# Patient Record
Sex: Male | Born: 1970 | Race: White | Hispanic: No | Marital: Married | Smoking: Never smoker
Health system: Southern US, Community
[De-identification: ages and names within clinical notes are randomized; demographics above are authoritative.]

## PROBLEM LIST (undated history)

## (undated) DIAGNOSIS — T8859XA Other complications of anesthesia, initial encounter: Secondary | ICD-10-CM

## (undated) DIAGNOSIS — K219 Gastro-esophageal reflux disease without esophagitis: Secondary | ICD-10-CM

---

## 2004-12-07 ENCOUNTER — Ambulatory Visit: Payer: Self-pay | Admitting: Internal Medicine

## 2005-06-07 ENCOUNTER — Ambulatory Visit: Payer: Self-pay | Admitting: Internal Medicine

## 2005-09-30 DIAGNOSIS — S4992XA Unspecified injury of left shoulder and upper arm, initial encounter: Secondary | ICD-10-CM | POA: Insufficient documentation

## 2005-12-05 ENCOUNTER — Ambulatory Visit: Payer: Self-pay | Admitting: Internal Medicine

## 2006-02-13 ENCOUNTER — Ambulatory Visit: Payer: Self-pay | Admitting: Internal Medicine

## 2006-03-07 ENCOUNTER — Ambulatory Visit: Payer: Self-pay | Admitting: Internal Medicine

## 2006-03-31 ENCOUNTER — Ambulatory Visit: Payer: Self-pay | Admitting: Internal Medicine

## 2008-02-16 ENCOUNTER — Ambulatory Visit: Payer: Self-pay | Admitting: Internal Medicine

## 2008-12-06 ENCOUNTER — Ambulatory Visit: Payer: Self-pay | Admitting: Internal Medicine

## 2011-05-24 ENCOUNTER — Ambulatory Visit: Payer: Self-pay | Admitting: Internal Medicine

## 2011-05-25 LAB — PSA

## 2012-06-13 ENCOUNTER — Other Ambulatory Visit: Payer: Self-pay | Admitting: Internal Medicine

## 2012-06-13 LAB — URINALYSIS, COMPLETE
Bilirubin,UR: NEGATIVE
Hyaline Cast: 1
Ketone: NEGATIVE
Leukocyte Esterase: NEGATIVE
Protein: NEGATIVE
RBC,UR: <1 /HPF (ref 0–5)
Specific Gravity: 1.027 (ref 1.003–1.030)
Squamous Epithelial: NONE SEEN
WBC UR: <1 /HPF (ref 0–5)

## 2012-06-13 LAB — CBC WITH DIFFERENTIAL/PLATELET
Basophil #: 0 10*3/uL (ref 0.0–0.1)
Basophil %: 0.6 %
Eosinophil #: 0.1 10*3/uL (ref 0.0–0.7)
HCT: 41.5 % (ref 40.0–52.0)
HGB: 13.7 g/dL (ref 13.0–18.0)
Lymphocyte %: 41.5 %
MCH: 30.7 pg (ref 26.0–34.0)
MCHC: 33 g/dL (ref 32.0–36.0)
Monocyte %: 8.9 %
WBC: 5.2 10*3/uL (ref 3.8–10.6)

## 2012-06-13 LAB — BASIC METABOLIC PANEL
Anion Gap: 8 (ref 7–16)
BUN: 16 mg/dL (ref 7–18)
Calcium, Total: 8.5 mg/dL (ref 8.5–10.1)
Chloride: 106 mmol/L (ref 98–107)
Creatinine: 0.7 mg/dL (ref 0.60–1.30)
EGFR (African American): >60

## 2012-06-13 LAB — LIPID PANEL: HDL Cholesterol: 51 mg/dL (ref 40–60)

## 2015-10-27 DIAGNOSIS — E785 Hyperlipidemia, unspecified: Secondary | ICD-10-CM | POA: Insufficient documentation

## 2015-10-27 DIAGNOSIS — R748 Abnormal levels of other serum enzymes: Secondary | ICD-10-CM | POA: Insufficient documentation

## 2019-12-23 ENCOUNTER — Ambulatory Visit: Payer: BC Managed Care – PPO | Attending: Internal Medicine

## 2019-12-23 DIAGNOSIS — Z20822 Contact with and (suspected) exposure to covid-19: Secondary | ICD-10-CM

## 2019-12-24 LAB — NOVEL CORONAVIRUS, NAA: SARS-CoV-2, NAA: NOT DETECTED

## 2020-08-08 ENCOUNTER — Ambulatory Visit: Payer: Self-pay | Attending: Critical Care Medicine

## 2020-08-08 DIAGNOSIS — Z23 Encounter for immunization: Secondary | ICD-10-CM

## 2020-08-08 NOTE — Progress Notes (Signed)
   Covid-19 Vaccination Clinic  Name:  Mark Wallace    MRN: [id number] DOB: 1971-08-18  08/08/2020  Mr. [name] was observed post Covid-19 immunization for 15 minutes without incident. He was provided with Vaccine Information Sheet and instruction to access the V-Safe system.   Mr. [name] was instructed to call 911 with any severe reactions post vaccine: Marland Kitchen Difficulty breathing  . Swelling of face and throat  . A fast heartbeat  . A bad rash all over body  . Dizziness and weakness   Immunizations Administered    Name Date Dose VIS Date Route   Pfizer COVID-19 Vaccine 08/08/2020  5:10 PM 0.3 mL 02/24/2019 Intramuscular   Manufacturer: [employer], [employer]   Lot: [id number]   [place]: [id number]

## 2020-08-29 ENCOUNTER — Ambulatory Visit: Payer: Self-pay | Attending: Internal Medicine

## 2020-08-29 DIAGNOSIS — Z23 Encounter for immunization: Secondary | ICD-10-CM

## 2020-08-29 NOTE — Progress Notes (Signed)
   Covid-19 Vaccination Clinic  Name:  Mark Wallace    MRN: [id number] DOB: 03-05-1971  08/29/2020  [name] was observed post Covid-19 immunization for 15 minutes without incident. He was provided with Vaccine Information Sheet and instruction to access the V-Safe system.   [name] was instructed to call 911 with any severe reactions post vaccine: Marland Kitchen Difficulty breathing  . Swelling of face and throat  . A fast heartbeat  . A bad rash all over body  . Dizziness and weakness   Immunizations Administered    Name Date Dose VIS Date Route   Pfizer COVID-19 Vaccine 08/29/2020  3:29 PM 0.3 mL 02/24/2019 Intramuscular   Manufacturer: Novel[employer]   Lot: [id number]   [place]: [id number]

## 2020-10-05 ENCOUNTER — Other Ambulatory Visit: Payer: BC Managed Care – PPO

## 2020-10-05 DIAGNOSIS — Z20822 Contact with and (suspected) exposure to covid-19: Secondary | ICD-10-CM

## 2020-10-06 LAB — SARS-COV-2, NAA 2 DAY TAT

## 2020-10-06 LAB — NOVEL CORONAVIRUS, NAA: SARS-CoV-2, NAA: NOT DETECTED

## 2020-10-15 HISTORY — PX: HAND SURGERY: SHX662

## 2020-10-15 HISTORY — PX: CHEST TUBE INSERTION: SHX231

## 2020-10-20 DIAGNOSIS — W19XXXA Unspecified fall, initial encounter: Secondary | ICD-10-CM | POA: Insufficient documentation

## 2020-10-20 DIAGNOSIS — E669 Obesity, unspecified: Secondary | ICD-10-CM | POA: Insufficient documentation

## 2020-10-20 DIAGNOSIS — S2220XA Unspecified fracture of sternum, initial encounter for closed fracture: Secondary | ICD-10-CM | POA: Insufficient documentation

## 2020-10-20 DIAGNOSIS — S2249XA Multiple fractures of ribs, unspecified side, initial encounter for closed fracture: Secondary | ICD-10-CM | POA: Insufficient documentation

## 2021-01-02 DIAGNOSIS — U071 COVID-19: Secondary | ICD-10-CM

## 2021-01-02 HISTORY — DX: COVID-19: U07.1

## 2021-01-09 DIAGNOSIS — D649 Anemia, unspecified: Secondary | ICD-10-CM | POA: Insufficient documentation

## 2021-01-09 DIAGNOSIS — Z9181 History of falling: Secondary | ICD-10-CM | POA: Insufficient documentation

## 2021-01-23 ENCOUNTER — Other Ambulatory Visit: Payer: Self-pay

## 2021-02-13 ENCOUNTER — Other Ambulatory Visit: Payer: Self-pay

## 2021-02-13 ENCOUNTER — Ambulatory Visit: Payer: BC Managed Care – PPO | Admitting: Gastroenterology

## 2021-02-13 VITALS — BP 134/87 | HR 70 | Temp 97.3°F | Ht 66.0 in | Wt 233.0 lb

## 2021-02-13 DIAGNOSIS — R1013 Epigastric pain: Secondary | ICD-10-CM | POA: Insufficient documentation

## 2021-02-13 MED ORDER — CLENPIQ 10-3.5-12 MG-GM -GM/160ML PO SOLN: 320.0000 mg | ORAL | 0 refills | Status: DC

## 2021-02-13 NOTE — Progress Notes (Signed)
Gastroenterology Consultation  Referring Provider:     Adin Hector, MD Primary Care Physician:  Adin Hector, MD Primary Gastroenterologist:  Dr. Allen Norris     Reason for Consultation:     Dyspepsia        HPI:   Mark Wallace is a 50 y.o. y/o male referred for consultation & management of Dyspepsia by Dr. Caryl Comes, Wendelyn Breslow III, MD.  This patient comes in today with a history of having abnormal liver enzymes as reported by his primary care provider that appears to have returned to normal in all of the most recent labs.  The patient also was found to have anemia with a normal iron and a ferritin that's on the lower limits of normal.  The elevated liver enzymes were reported to have resolved with weight loss.  The patient's most recent hemoglobin was 13.9 which is slightly below the lower limit of normal. The patient comes to see me after he states that his wife has seen in the past and he wanted to follow up with me.  The patient reports that his abdominal discomfort and reflux symptoms are worse when he gains weight or when he does not watch his diet.  The patient also reports that at certain times he doesn't take his medication and all and is fine and at other times he has to take it on a regular basis.  No past medical history on file.   Prior to Admission medications   Not on File    No family history on file.      Allergies as of 02/13/2021  . (No Known Allergies)    Review of Systems:    All systems reviewed and negative except where noted in HPI.   Physical Exam:  BP 134/87   Pulse 70   Temp (!) 97.3 F (36.3 C) (Temporal)   Ht 5\' 6"  (1.676 m)   Wt 233 lb (105.7 kg)   BMI 37.61 kg/m  No LMP for male patient. General:   Alert,  Well-developed, well-nourished, pleasant and cooperative in NAD Head:  Normocephalic and atraumatic. Eyes:  Sclera clear, no icterus.   Conjunctiva pink. Ears:  Normal auditory acuity. Neck:  Supple; no masses or thyromegaly. Lungs:   Respirations even and unlabored.  Clear throughout to auscultation.   No wheezes, crackles, or rhonchi. No acute distress. Heart:  Regular rate and rhythm; no murmurs, clicks, rubs, or gallops. Abdomen:  Normal bowel sounds.  No bruits.  Soft, non-tender and non-distended without masses, hepatosplenomegaly or hernias noted.  No guarding or rebound tenderness.  Negative Carnett sign.   Rectal:  Deferred.  Pulses:  Normal pulses noted. Extremities:  No clubbing or edema.  No cyanosis. Neurologic:  Alert and oriented x3;  grossly normal neurologically. Skin:  Intact without significant lesions or rashes.  No jaundice. Lymph Nodes:  No significant cervical adenopathy. Psych:  Alert and cooperative. Normal mood and affect.  Imaging Studies: No results found.  Assessment and Plan:   Mark Wallace is a 50 y.o. y/o male who comes in today with a history of mild anemia with normal iron studies and in need of a colonoscopy since he has never had a colonoscopy in the past.  The patient also has some dyspepsia with reflux symptoms.  The patient reports that he had imaging at some time in the past that stated he had a hiatal hernia.  The patient will be set up for an EGD  and colonoscopy due to his history of dyspepsia and need for screening colonoscopy.  The patient has been the plan and agrees with it.    [name], MD. Marval Regal    Note: This dictation was prepared with Dragon dictation along with smaller phrase technology. Any transcriptional errors that result from this process are unintentional.

## 2021-02-13 NOTE — H&P (View-Only) (Signed)
Gastroenterology Consultation  Referring Provider:     Adin Hector, MD Primary Care Physician:  Adin Hector, MD Primary Gastroenterologist:  Dr. Allen Norris     Reason for Consultation:     Dyspepsia        HPI:   Mark Wallace is a 50 y.o. y/o male referred for consultation & management of Dyspepsia by Dr. Caryl Comes, Wendelyn Breslow III, MD.  This patient comes in today with a history of having abnormal liver enzymes as reported by his primary care provider that appears to have returned to normal in all of the most recent labs.  The patient also was found to have anemia with a normal iron and a ferritin that's on the lower limits of normal.  The elevated liver enzymes were reported to have resolved with weight loss.  The patient's most recent hemoglobin was 13.9 which is slightly below the lower limit of normal. The patient comes to see me after he states that his wife has seen in the past and he wanted to follow up with me.  The patient reports that his abdominal discomfort and reflux symptoms are worse when he gains weight or when he does not watch his diet.  The patient also reports that at certain times he doesn't take his medication and all and is fine and at other times he has to take it on a regular basis.  No past medical history on file.   Prior to Admission medications   Not on File    No family history on file.      Allergies as of 02/13/2021  . (No Known Allergies)    Review of Systems:    All systems reviewed and negative except where noted in HPI.   Physical Exam:  BP 134/87   Pulse 70   Temp (!) 97.3 F (36.3 C) (Temporal)   Ht 5\' 6"  (1.676 m)   Wt 233 lb (105.7 kg)   BMI 37.61 kg/m  No LMP for male patient. General:   Alert,  Well-developed, well-nourished, pleasant and cooperative in NAD Head:  Normocephalic and atraumatic. Eyes:  Sclera clear, no icterus.   Conjunctiva pink. Ears:  Normal auditory acuity. Neck:  Supple; no masses or thyromegaly. Lungs:   Respirations even and unlabored.  Clear throughout to auscultation.   No wheezes, crackles, or rhonchi. No acute distress. Heart:  Regular rate and rhythm; no murmurs, clicks, rubs, or gallops. Abdomen:  Normal bowel sounds.  No bruits.  Soft, non-tender and non-distended without masses, hepatosplenomegaly or hernias noted.  No guarding or rebound tenderness.  Negative Carnett sign.   Rectal:  Deferred.  Pulses:  Normal pulses noted. Extremities:  No clubbing or edema.  No cyanosis. Neurologic:  Alert and oriented x3;  grossly normal neurologically. Skin:  Intact without significant lesions or rashes.  No jaundice. Lymph Nodes:  No significant cervical adenopathy. Psych:  Alert and cooperative. Normal mood and affect.  Imaging Studies: No results found.  Assessment and Plan:   Mark Wallace is a 50 y.o. y/o male who comes in today with a history of mild anemia with normal iron studies and in need of a colonoscopy since he has never had a colonoscopy in the past.  The patient also has some dyspepsia with reflux symptoms.  The patient reports that he had imaging at some time in the past that stated he had a hiatal hernia.  The patient will be set up for an EGD  and colonoscopy due to his history of dyspepsia and need for screening colonoscopy.  The patient has been the plan and agrees with it.    [name], MD. Marval Regal    Note: This dictation was prepared with Dragon dictation along with smaller phrase technology. Any transcriptional errors that result from this process are unintentional.

## 2021-02-15 ENCOUNTER — Other Ambulatory Visit: Payer: Self-pay

## 2021-02-15 DIAGNOSIS — R1013 Epigastric pain: Secondary | ICD-10-CM

## 2021-02-15 DIAGNOSIS — Z1211 Encounter for screening for malignant neoplasm of colon: Secondary | ICD-10-CM

## 2021-02-27 ENCOUNTER — Other Ambulatory Visit: Payer: Self-pay

## 2021-02-27 ENCOUNTER — Encounter: Payer: Self-pay | Admitting: Gastroenterology

## 2021-03-01 ENCOUNTER — Other Ambulatory Visit
Admission: RE | Admit: 2021-03-01 | Discharge: 2021-03-01 | Disposition: A | Discharge status: Home / Self Care | Payer: BC Managed Care – PPO | Source: Ambulatory Visit | Attending: Gastroenterology | Admitting: Gastroenterology

## 2021-03-01 ENCOUNTER — Other Ambulatory Visit: Payer: Self-pay

## 2021-03-01 DIAGNOSIS — Z01812 Encounter for preprocedural laboratory examination: Secondary | ICD-10-CM | POA: Insufficient documentation

## 2021-03-01 DIAGNOSIS — K449 Diaphragmatic hernia without obstruction or gangrene: Secondary | ICD-10-CM | POA: Diagnosis not present

## 2021-03-01 DIAGNOSIS — Z8616 Personal history of COVID-19: Secondary | ICD-10-CM | POA: Diagnosis not present

## 2021-03-01 DIAGNOSIS — D125 Benign neoplasm of sigmoid colon: Secondary | ICD-10-CM | POA: Diagnosis not present

## 2021-03-01 DIAGNOSIS — D649 Anemia, unspecified: Secondary | ICD-10-CM | POA: Diagnosis not present

## 2021-03-01 DIAGNOSIS — Z20822 Contact with and (suspected) exposure to covid-19: Secondary | ICD-10-CM | POA: Diagnosis not present

## 2021-03-01 DIAGNOSIS — Z79899 Other long term (current) drug therapy: Secondary | ICD-10-CM | POA: Diagnosis not present

## 2021-03-01 DIAGNOSIS — R1013 Epigastric pain: Secondary | ICD-10-CM | POA: Diagnosis present

## 2021-03-01 DIAGNOSIS — Z1211 Encounter for screening for malignant neoplasm of colon: Secondary | ICD-10-CM | POA: Diagnosis present

## 2021-03-01 DIAGNOSIS — K640 First degree hemorrhoids: Secondary | ICD-10-CM | POA: Diagnosis not present

## 2021-03-01 DIAGNOSIS — E669 Obesity, unspecified: Secondary | ICD-10-CM | POA: Diagnosis not present

## 2021-03-01 DIAGNOSIS — Z6837 Body mass index (BMI) 37.0-37.9, adult: Secondary | ICD-10-CM | POA: Diagnosis not present

## 2021-03-01 LAB — SARS CORONAVIRUS 2 (TAT 6-24 HRS): SARS Coronavirus 2: NEGATIVE

## 2021-03-02 NOTE — Discharge Instructions (Signed)

## 2021-03-03 ENCOUNTER — Ambulatory Visit: Payer: BC Managed Care – PPO | Admitting: Anesthesiology

## 2021-03-03 ENCOUNTER — Ambulatory Visit
Admission: RE | Admit: 2021-03-03 | Discharge: 2021-03-03 | Disposition: A | Discharge status: Home / Self Care | Payer: BC Managed Care – PPO | Attending: Gastroenterology | Admitting: Gastroenterology

## 2021-03-03 ENCOUNTER — Encounter
Admission: RE | Disposition: A | Discharge status: Home / Self Care | Payer: Self-pay | Source: Home / Self Care | Attending: Gastroenterology

## 2021-03-03 ENCOUNTER — Other Ambulatory Visit: Payer: Self-pay

## 2021-03-03 ENCOUNTER — Encounter: Payer: Self-pay | Admitting: Gastroenterology

## 2021-03-03 DIAGNOSIS — R1013 Epigastric pain: Secondary | ICD-10-CM | POA: Diagnosis not present

## 2021-03-03 DIAGNOSIS — K640 First degree hemorrhoids: Secondary | ICD-10-CM | POA: Insufficient documentation

## 2021-03-03 DIAGNOSIS — Z79899 Other long term (current) drug therapy: Secondary | ICD-10-CM | POA: Insufficient documentation

## 2021-03-03 DIAGNOSIS — E669 Obesity, unspecified: Secondary | ICD-10-CM | POA: Insufficient documentation

## 2021-03-03 DIAGNOSIS — K635 Polyp of colon: Secondary | ICD-10-CM | POA: Diagnosis not present

## 2021-03-03 DIAGNOSIS — Z20822 Contact with and (suspected) exposure to covid-19: Secondary | ICD-10-CM | POA: Insufficient documentation

## 2021-03-03 DIAGNOSIS — D125 Benign neoplasm of sigmoid colon: Secondary | ICD-10-CM | POA: Insufficient documentation

## 2021-03-03 DIAGNOSIS — Z1211 Encounter for screening for malignant neoplasm of colon: Secondary | ICD-10-CM

## 2021-03-03 DIAGNOSIS — Z6837 Body mass index (BMI) 37.0-37.9, adult: Secondary | ICD-10-CM | POA: Insufficient documentation

## 2021-03-03 DIAGNOSIS — Z8616 Personal history of COVID-19: Secondary | ICD-10-CM | POA: Insufficient documentation

## 2021-03-03 DIAGNOSIS — K449 Diaphragmatic hernia without obstruction or gangrene: Secondary | ICD-10-CM | POA: Insufficient documentation

## 2021-03-03 DIAGNOSIS — D649 Anemia, unspecified: Secondary | ICD-10-CM | POA: Insufficient documentation

## 2021-03-03 HISTORY — PX: ESOPHAGOGASTRODUODENOSCOPY (EGD) WITH PROPOFOL: SHX5813

## 2021-03-03 HISTORY — PX: COLONOSCOPY WITH PROPOFOL: SHX5780

## 2021-03-03 HISTORY — DX: Gastro-esophageal reflux disease without esophagitis: K21.9

## 2021-03-03 HISTORY — PX: POLYPECTOMY: SHX5525

## 2021-03-03 HISTORY — DX: Other complications of anesthesia, initial encounter: T88.59XA

## 2021-03-03 SURGERY — COLONOSCOPY WITH PROPOFOL
Anesthesia: General | Site: Rectum

## 2021-03-03 MED ORDER — LIDOCAINE HCL (CARDIAC) PF 100 MG/5ML IV SOSY
PREFILLED_SYRINGE | INTRAVENOUS | Status: DC | PRN
  Administered 2021-03-03: 50 mg via INTRAVENOUS

## 2021-03-03 MED ORDER — SODIUM CHLORIDE 0.9 % IV SOLN: INTRAVENOUS | Status: DC

## 2021-03-03 MED ORDER — GLYCOPYRROLATE 0.2 MG/ML IJ SOLN
INTRAMUSCULAR | Status: DC | PRN
  Administered 2021-03-03: .1 mg via INTRAVENOUS

## 2021-03-03 MED ORDER — PROPOFOL 10 MG/ML IV BOLUS
INTRAVENOUS | Status: DC | PRN
  Administered 2021-03-03: 150 mg via INTRAVENOUS
  Administered 2021-03-03: 30 mg via INTRAVENOUS
  Administered 2021-03-03: 40 mg via INTRAVENOUS
  Administered 2021-03-03: 30 mg via INTRAVENOUS
  Administered 2021-03-03: 50 mg via INTRAVENOUS
  Administered 2021-03-03: 40 mg via INTRAVENOUS
  Administered 2021-03-03: 30 mg via INTRAVENOUS
  Administered 2021-03-03: 40 mg via INTRAVENOUS

## 2021-03-03 MED ORDER — LACTATED RINGERS IV SOLN: INTRAVENOUS | Status: DC

## 2021-03-03 MED ORDER — STERILE WATER FOR IRRIGATION IR SOLN: Status: DC | PRN

## 2021-03-03 SURGICAL SUPPLY — 9 items
BLOCK BITE 60FR ADLT L/F GRN (MISCELLANEOUS) ×3 IMPLANT
GOWN CVR UNV OPN BCK APRN NK (MISCELLANEOUS) ×4 IMPLANT
GOWN ISOL THUMB LOOP REG UNIV (MISCELLANEOUS) ×6
KIT PRC NS LF DISP ENDO (KITS) ×2 IMPLANT
KIT PROCEDURE OLYMPUS (KITS) ×3
MANIFOLD NEPTUNE II (INSTRUMENTS) ×3 IMPLANT
SNARE COLD EXACTO (MISCELLANEOUS) ×3 IMPLANT
TRAP ETRAP POLY (MISCELLANEOUS) ×3 IMPLANT
WATER STERILE IRR 250ML POUR (IV SOLUTION) ×3 IMPLANT

## 2021-03-03 NOTE — Anesthesia Postprocedure Evaluation (Signed)
Anesthesia Post Note  Patient: Mark Wallace  Procedure(s) Performed: COLONOSCOPY WITH BIOPSY (N/A Rectum) ESOPHAGOGASTRODUODENOSCOPY (EGD) WITH PROPOFOL (N/A ) POLYPECTOMY (N/A Rectum)     Patient location during evaluation: PACU Anesthesia Type: General Level of consciousness: awake Pain management: pain level controlled Vital Signs Assessment: post-procedure vital signs reviewed and stable Respiratory status: respiratory function stable Cardiovascular status: stable Postop Assessment: no signs of nausea or vomiting Anesthetic complications: no   No complications documented.  Veda Canning

## 2021-03-03 NOTE — Op Note (Signed)
[facility] Gastroenterology Patient Name: Mark Wallace Procedure Date: 03/03/2021 10:03 AM MRN: [id number] Account #: [id number] Date of Birth: Mar 11, 1971 Admit Type: Outpatient Age: 50 Room: [facility] OR ROOM 01 Gender: Male Note Status: Finalized Procedure:             Colonoscopy Indications:           Screening for colorectal malignant neoplasm Providers:             [name] MD, MD Referring MD:          Ramonita Lab, MD (Referring MD) Medicines:             Propofol per Anesthesia Complications:         No immediate complications. Procedure:             Pre-Anesthesia Assessment:                        - Prior to the procedure, a History and Physical was                         performed, and patient medications and allergies were                         reviewed. The patient's tolerance of previous                         anesthesia was also reviewed. The risks and benefits                         of the procedure and the sedation options and risks                         were discussed with the patient. All questions were                         answered, and informed consent was obtained. Prior                         Anticoagulants: The patient has taken no previous                         anticoagulant or antiplatelet agents. ASA Grade                         Assessment: II - A patient with mild systemic disease.                         After reviewing the risks and benefits, the patient                         was deemed in satisfactory condition to undergo the                         procedure.                        After obtaining informed consent, the colonoscope was  passed under direct vision. Throughout the procedure,                         the patient's blood pressure, pulse, and oxygen                         saturations were monitored continuously. The                         Colonoscope was introduced through the anus  and                         advanced to the the cecum, identified by appendiceal                         orifice and ileocecal valve. The colonoscopy was                         performed without difficulty. The patient tolerated                         the procedure well. The quality of the bowel                         preparation was excellent. Findings:      The perianal and digital rectal examinations were normal.      A 4 mm polyp was found in the sigmoid colon. The polyp was pedunculated.       The polyp was removed with a cold snare. Resection and retrieval were       complete.      Non-bleeding internal hemorrhoids were found during retroflexion. The       hemorrhoids were Grade I (internal hemorrhoids that do not prolapse). Impression:            - One 4 mm polyp in the sigmoid colon, removed with a                         cold snare. Resected and retrieved.                        - Non-bleeding internal hemorrhoids. Recommendation:        - Discharge patient to home.                        - Resume previous diet.                        - Continue present medications.                        - Await pathology results.                        - Repeat colonoscopy in 7 years if polyp adenoma and                         10 years if hyperplastic Procedure Code(s):     --- Professional ---  45385, Colonoscopy, flexible; with removal of                         tumor(s), polyp(s), or other lesion(s) by snare                         technique Diagnosis Code(s):     --- Professional ---                        Z12.11, Encounter for screening for malignant neoplasm                         of colon                        K63.5, Polyp of colon CPT copyright 2019 American Medical Association. All rights reserved. The codes documented in this report are preliminary and upon coder review may  be revised to meet current compliance requirements. [name] MD,  MD 03/03/2021 10:26:58 AM This report has been signed electronically. Number of Addenda: 0 Note Initiated On: 03/03/2021 10:03 AM Scope Withdrawal Time: 0 hours 6 minutes 44 seconds  Total Procedure Duration: 0 hours 10 minutes 9 seconds  Estimated Blood Loss:  Estimated blood loss: none.      [facility]

## 2021-03-03 NOTE — Anesthesia Preprocedure Evaluation (Signed)
Anesthesia Evaluation  Patient identified by MRN, date of birth, ID band Patient awake    Reviewed: Allergy & Precautions, NPO status , Patient's Chart, lab work & pertinent test results  Airway Mallampati: II  TM Distance: >3 FB     Dental   Pulmonary neg recent URI,  COVID-19 01/02/21   breath sounds clear to auscultation       Cardiovascular  Rhythm:Regular Rate:Normal  HLD   Neuro/Psych    GI/Hepatic GERD  ,  Endo/Other  Obesity - BMI 37  Renal/GU      Musculoskeletal   Abdominal   Peds  Hematology  (+) anemia ,   Anesthesia Other Findings   Reproductive/Obstetrics                             Anesthesia Physical Anesthesia Plan  ASA: II  Anesthesia Plan: General   Post-op Pain Management:    Induction: Intravenous  PONV Risk Score and Plan: Propofol infusion, TIVA and Treatment may vary due to age or medical condition  Airway Management Planned: Natural Airway and Nasal Cannula  Additional Equipment:   Intra-op Plan:   Post-operative Plan:   Informed Consent: I have reviewed the patients History and Physical, chart, labs and discussed the procedure including the risks, benefits and alternatives for the proposed anesthesia with the patient or authorized representative who has indicated his/her understanding and acceptance.       Plan Discussed with: CRNA  Anesthesia Plan Comments:         Anesthesia Quick Evaluation

## 2021-03-03 NOTE — Anesthesia Procedure Notes (Signed)
Date/Time: 03/03/2021 10:05 AM Performed by: Cameron Ali, CRNA Pre-anesthesia Checklist: Patient identified, Emergency Drugs available, Suction available, Timeout performed and Patient being monitored Patient Re-evaluated:Patient Re-evaluated prior to induction Oxygen Delivery Method: Nasal cannula Placement Confirmation: positive ETCO2

## 2021-03-03 NOTE — Op Note (Signed)
[facility] Gastroenterology Patient Name: Mark Wallace Procedure Date: 03/03/2021 10:02 AM MRN: [id number] Account #: [id number] Date of Birth: 12-Jul-1971 Admit Type: Outpatient Age: 50 Room: [facility] OR ROOM 01 Gender: Male Note Status: Finalized Procedure:             Upper GI endoscopy Indications:           Heartburn Providers:             [name] MD, MD Referring MD:          Ramonita Lab, MD (Referring MD) Medicines:             Propofol per Anesthesia Complications:         No immediate complications. Procedure:             Pre-Anesthesia Assessment:                        - Prior to the procedure, a History and Physical was                         performed, and patient medications and allergies were                         reviewed. The patient's tolerance of previous                         anesthesia was also reviewed. The risks and benefits                         of the procedure and the sedation options and risks                         were discussed with the patient. All questions were                         answered, and informed consent was obtained. Prior                         Anticoagulants: The patient has taken no previous                         anticoagulant or antiplatelet agents. ASA Grade                         Assessment: II - A patient with mild systemic disease.                         After reviewing the risks and benefits, the patient                         was deemed in satisfactory condition to undergo the                         procedure.                        After obtaining informed consent, the endoscope was  passed under direct vision. Throughout the procedure,                         the patient's blood pressure, pulse, and oxygen                         saturations were monitored continuously. The Endoscope                         was introduced through the mouth, and advanced to the                          second part of duodenum. The upper GI endoscopy was                         accomplished without difficulty. The patient tolerated                         the procedure well. Findings:      A medium-sized hiatal hernia was present.      The stomach was normal.      The examined duodenum was normal. Impression:            - Medium-sized hiatal hernia.                        - Normal stomach.                        - Normal examined duodenum.                        - No specimens collected. Recommendation:        - Discharge patient to home.                        - Resume previous diet.                        - Continue present medications.                        - Perform a colonoscopy today. Procedure Code(s):     --- Professional ---                        [phone], Esophagogastroduodenoscopy, flexible,                         transoral; diagnostic, including collection of                         specimen(s) by brushing or washing, when performed                         (separate procedure) Diagnosis Code(s):     --- Professional ---                        R12, Heartburn CPT copyright 2019 American Medical Association. All rights reserved. The codes documented in this report are preliminary and upon coder review may  be revised to meet current compliance requirements. Corydon Schweiss  Mahkayla Preece MD, MD 03/03/2021 10:13:25 AM This report has been signed electronically. Number of Addenda: 0 Note Initiated On: 03/03/2021 10:02 AM Estimated Blood Loss:  Estimated blood loss: none.      [facility]

## 2021-03-03 NOTE — Interval H&P Note (Signed)
Mark Lame, MD [facility] [address] [place], [place] [address] Phone:[phone] Fax : [phone]  Primary Care Physician:  Adin Hector, MD Primary Gastroenterologist:  Dr. Allen Norris  Pre-Procedure History & Physical: HPI:  Mark Wallace is a 50 y.o. male is here for an endoscopy and colonoscopy.   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  Was not tested.   GERD (gastroesophageal reflux disease)     Past Surgical History:  Procedure Laterality Date   CHEST TUBE INSERTION  10/15/2020   HAND SURGERY  10/15/2020    Prior to Admission medications   Medication Sig Start Date End Date Taking? Authorizing Provider  Ferrous Sulfate (IRON PO) Take by mouth daily.   Yes [provider]  gabapentin (NEURONTIN) 300 MG capsule Take 300 mg by mouth 3 (three) times daily. 11/11/20  Yes [provider]  Multiple Vitamin (MULTIVITAMIN) tablet Take 1 tablet by mouth daily.   Yes [provider]  omeprazole (PRILOSEC) 40 MG capsule Take 40 mg by mouth daily. 02/01/21  Yes [provider]  Sod Picosulfate-Mag Ox-Cit Acd (CLENPIQ) 10-3.5-12 MG-GM -GM/160ML SOLN Take 320 mg by mouth as directed. 02/13/21  Yes Mark Lame, MD  VITAMIN D PO Take by mouth daily.   Yes [provider]  zinc gluconate 50 MG tablet Take 50 mg by mouth daily.   Yes [provider]    Allergies as of 02/15/2021   (No Known Allergies)    History reviewed. No pertinent family history.  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   Drug use: Not on file   Sexual activity: Not on file  Other Topics Concern   Not on file  Social History Narrative   Not on file   Social Determinants of Health   Financial Resource Strain: Not on file  Food Insecurity: Not on file  Transportation Needs: Not on file  Physical Activity: Not on file  Stress: Not on file  Social Connections: Not on file  Intimate Partner Violence: Not on file    Review of Systems: See HPI, otherwise negative ROS  Physical Exam: BP (!) 124/91   Pulse 67   Temp (!) 97.2 F (36.2 C)   Resp 16   Ht 5\' 6"  (1.676 m)   Wt 103 kg   SpO2 95%   BMI 36.64 kg/m  General:   Alert,  pleasant and cooperative in NAD Head:  Normocephalic and atraumatic. Neck:  Supple; no masses or thyromegaly. Lungs:  Clear throughout to auscultation.    Heart:  Regular rate and rhythm. Abdomen:  Soft, nontender and nondistended. Normal bowel sounds, without guarding, and without rebound.   Neurologic:  Alert and  oriented x4;  grossly normal neurologically.  Impression/Plan: LAURI TILL is here for an endoscopy and colonoscopy to be  performed for screening and GERD  Risks, benefits, limitations, and alternatives regarding  endoscopy and colonoscopy have been reviewed with the patient.  Questions have been answered.  All parties agreeable.   Mark Lame, MD  03/03/2021, 10:00 AM

## 2021-03-03 NOTE — Transfer of Care (Signed)
Immediate Anesthesia Transfer of Care Note  Patient: Mark Wallace  Procedure(s) Performed: COLONOSCOPY WITH BIOPSY (N/A Rectum) ESOPHAGOGASTRODUODENOSCOPY (EGD) WITH PROPOFOL (N/A ) POLYPECTOMY (N/A Rectum)  Patient Location: PACU  Anesthesia Type: General  Level of Consciousness: awake, alert  and patient cooperative  Airway and Oxygen Therapy: Patient Spontanous Breathing and Patient connected to supplemental oxygen  Post-op Assessment: Post-op Vital signs reviewed, Patient's Cardiovascular Status Stable, Respiratory Function Stable, Patent Airway and No signs of Nausea or vomiting  Post-op Vital Signs: Reviewed and stable  Complications: No complications documented.

## 2021-03-06 ENCOUNTER — Encounter: Payer: Self-pay | Admitting: Gastroenterology

## 2021-03-06 LAB — SURGICAL PATHOLOGY

## 2021-05-03 ENCOUNTER — Telehealth: Payer: Self-pay

## 2021-05-03 NOTE — Telephone Encounter (Signed)
Patient says Dr. Dahlia Byes office needs a referral from Dr. Allen Norris. [facility] made the appt for him, just needs his medical info Please fax and send patient a mychart message.

## 2021-05-04 ENCOUNTER — Other Ambulatory Visit: Payer: Self-pay

## 2021-05-04 DIAGNOSIS — K449 Diaphragmatic hernia without obstruction or gangrene: Secondary | ICD-10-CM

## 2021-05-04 NOTE — Telephone Encounter (Signed)
Referral has been sent to [facility] as requested by the patient.

## 2021-05-15 ENCOUNTER — Ambulatory Visit: Payer: BC Managed Care – PPO | Admitting: Surgery

## 2021-05-15 ENCOUNTER — Encounter: Payer: Self-pay | Admitting: Surgery

## 2021-05-15 ENCOUNTER — Other Ambulatory Visit: Payer: Self-pay

## 2021-05-15 VITALS — BP 146/93 | HR 55 | Temp 98.5°F | Ht 66.0 in | Wt 241.0 lb

## 2021-05-15 DIAGNOSIS — K449 Diaphragmatic hernia without obstruction or gangrene: Secondary | ICD-10-CM | POA: Diagnosis not present

## 2021-05-15 NOTE — Patient Instructions (Addendum)
We will get you set up for a CT of the Chest and Abdomen. We will also get you set up for a Barium Swallow study.  You will follow up here after we get all the results of these tests.   We encourage you to loose weight prior to surgery through diet and exercise. Ideally we would like you to loose 15-20 pounds.   You are scheduled for a Barium swallow at [facility] entrance on 05/25/21 at 8:00 am. You will need to arrive there by 7:45 am and have nothing to eat or drink for 3 hours prior.    You are scheduled for a CT scan of your chest, abdomen, and pelvis at [facility] entrance on 06/02/21 at 8:00 am. You will need to arrive there by 7:45 am. You will need to have nothing to eat or drink for 4 hours prior. You will need to pickup a prep kit, you may do this today.

## 2021-05-16 NOTE — Progress Notes (Signed)
Patient ID: FIORE DETJEN, male   DOB: 1971/05/01, 50 y.o.   MRN: [id number]  HPI Mark Wallace is a 50 y.o. male seen in consultation at the request of Dr Allen Norris. For paraesophageal hernia. Reports significant reflux symptoms.  He states that PPI helps but even with PPI he still reports significant reflux.  He experiences heartburn.  He does some occasional cough and symptoms seems to be worse when he lays down.  The last few months symptoms have been getting worse.  He did have an EGD that I have personally reviewed showing evidence of a moderate hiatal hernia. He  Does not have any dysphagia.  His BMI is 38. Did have history of blunt trauma last year requiring chest tube on the left side and he did have multiple rib fractures and I think he had a sternal fracture.  Able to perform more than 4 METS of activity without any shortness of breath or chest pain. I have reviewed the medical record extensively at [facility] showing evidence of a prior hiatal hernia.  I do not have the actual images.  Last CMP and CBC were completely normal. Date required left hand surgery with a recent trauma.  Did not have any complications related to anesthesia.  HPI  Past Medical History:  Diagnosis Date  . Complication of anesthesia    BP and breathing were "unstable" after surgery 10/15/20  . 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  Was not tested.  Marland Kitchen GERD (gastroesophageal reflux disease)     Past Surgical History:  Procedure Laterality Date  . CHEST TUBE INSERTION  10/15/2020  . COLONOSCOPY WITH PROPOFOL N/A 03/03/2021   Procedure: COLONOSCOPY WITH BIOPSY;  Surgeon: [name], MD;  Location: [place];  Service: Endoscopy;  Laterality: N/A;  . ESOPHAGOGASTRODUODENOSCOPY (EGD) WITH PROPOFOL N/A 03/03/2021   Procedure: ESOPHAGOGASTRODUODENOSCOPY (EGD) WITH PROPOFOL;  Surgeon: [name], MD;  Location: [place];  Service: Endoscopy;  Laterality: N/A;  . HAND SURGERY  10/15/2020   . POLYPECTOMY N/A 03/03/2021   Procedure: POLYPECTOMY;  Surgeon: [name], MD;  Location: [place];  Service: Endoscopy;  Laterality: N/A;   No pertinent family history  Social History Social History   Tobacco Use  . Smoking status: Never Smoker  . Smokeless tobacco: Former Network engineer  . Vaping Use: Never used  Substance Use Topics  . 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 Ferrous Sulfate (IRON PO) Take by mouth daily.    . Multiple Vitamin (MULTIVITAMIN) tablet Take 1 tablet by mouth daily.    Marland Kitchen omeprazole (PRILOSEC) 40 MG capsule Take 40 mg by mouth daily.    Marland Kitchen VITAMIN D PO Take by mouth daily.    Marland Kitchen zinc gluconate 50 MG tablet Take 50 mg by mouth daily.     No current facility-administered medications for this visit.     Review of Systems Full ROS  was asked and was negative except for the information on the HPI  Physical Exam Blood pressure (!) 146/93, pulse (!) 55, temperature 98.5 F (36.9 C), height 5\' 6"  (1.676 m), weight 241 lb (109.3 kg), SpO2 97 %. CONSTITUTIONAL: NAD BMI 38 EYES: Pupils are equal, round, , Sclera are non-icteric. EARS, NOSE, MOUTH AND THROAT: He is wearing a mask. Hearing is intact to voice. LYMPH NODES:  Lymph nodes  in the neck are normal. RESPIRATORY:  Lungs are clear. There is normal respiratory effort, with equal breath sounds bilaterally, and without pathologic use of accessory muscles. CARDIOVASCULAR: Heart is regular without murmurs, gallops, or rubs. GI: The abdomen is soft, nontender, and nondistended. There are no palpable masses. There is no hepatosplenomegaly. There are normal bowel sounds in all quadrants. GU: Rectal deferred.   MUSCULOSKELETAL: Normal muscle strength and tone. No cyanosis or edema.   SKIN: Turgor is good and there are no pathologic skin lesions or ulcers. NEUROLOGIC: Motor and  sensation is grossly normal. Cranial nerves are grossly intact. PSYCH:  Oriented to person, place and time. Affect is normal.  Data Reviewed  I have personally reviewed the patient's imaging, laboratory findings and medical records.    Assessment/Plan  50 year old male with moderate hiatal hernia and significant reflux symptoms.  Does have a BMI of 38.  Discussed with the patient in detail about the disease process.  Ideally will like him to lose weight and we have agreed upon 20 to 25 pounds as a [place] to potentially proceed with surgical intervention and repair of the hiatal hernia.  He is interested in pursuing further work-up.  We will obtain a CT scan of the chest abdomen and pelvis to eval his mediastinal and abdominal anatomy.  Given that he had prior trauma I want also make sure there is no diaphragmatic hernias that are traumatic that will need to be addressed as well.  We will also order a barium swallow.  After he completes the work-up we will bring him back.  I had an extensive discussion with him regarding diet and exercise and the ability to have a BMI of 35 or so.  Have also discussed other options including bariatric surgery.  Currently he is not interested in bariatric surgery and wishes to try to lose weight on his own.  Tensive counseling provided.  Please note that I spent over 80 minutes in this encounter including coordination and counseling of his care, reviewing medical records and  appropriate documentation copy of this report was sent to the referring provider     Caroleen Hamman, MD [facility] 05/16/2021, 10:33 AM

## 2021-05-25 ENCOUNTER — Ambulatory Visit
Admission: RE | Admit: 2021-05-25 | Discharge: 2021-05-25 | Disposition: A | Discharge status: Home / Self Care | Payer: BC Managed Care – PPO | Source: Ambulatory Visit | Attending: Surgery | Admitting: Surgery

## 2021-05-25 ENCOUNTER — Other Ambulatory Visit: Payer: Self-pay

## 2021-05-25 ENCOUNTER — Telehealth: Payer: Self-pay

## 2021-05-25 DIAGNOSIS — K449 Diaphragmatic hernia without obstruction or gangrene: Secondary | ICD-10-CM | POA: Insufficient documentation

## 2021-05-25 NOTE — Telephone Encounter (Signed)
Pt notified of barium swallow results and to keep appt with Dr. Dahlia Byes. Verbalizes understanding.

## 2021-05-30 ENCOUNTER — Ambulatory Visit
Admission: RE | Admit: 2021-05-30 | Discharge: 2021-05-30 | Disposition: A | Discharge status: Home / Self Care | Payer: BC Managed Care – PPO | Source: Ambulatory Visit | Attending: Surgery | Admitting: Surgery

## 2021-05-30 ENCOUNTER — Telehealth: Payer: Self-pay

## 2021-05-30 ENCOUNTER — Other Ambulatory Visit: Payer: Self-pay

## 2021-05-30 DIAGNOSIS — K449 Diaphragmatic hernia without obstruction or gangrene: Secondary | ICD-10-CM | POA: Insufficient documentation

## 2021-05-30 NOTE — Telephone Encounter (Signed)
Pt called back for results to CT scan. Verbalizes understanding.

## 2021-05-30 NOTE — Telephone Encounter (Signed)
LVM for pt to call the clinic for test results.

## 2021-06-02 ENCOUNTER — Other Ambulatory Visit: Payer: BC Managed Care – PPO

## 2021-06-12 ENCOUNTER — Other Ambulatory Visit: Payer: Self-pay

## 2021-06-12 ENCOUNTER — Ambulatory Visit: Payer: BC Managed Care – PPO | Admitting: Surgery

## 2021-06-12 ENCOUNTER — Telehealth: Payer: Self-pay | Admitting: Gastroenterology

## 2021-06-12 DIAGNOSIS — K449 Diaphragmatic hernia without obstruction or gangrene: Secondary | ICD-10-CM

## 2021-06-12 NOTE — Telephone Encounter (Signed)
Wants another referral for 2nd opionion

## 2021-06-12 NOTE — Telephone Encounter (Signed)
Pt sent a message through Mychart with referral request to [facility]. Referral sent to:   Can you send a referral to Alice Reichert, MD office for large sliding hiatal hernia [facility] [address] [place], [place] [address] Phone: [phone] Fax: [phone]

## 2021-08-01 ENCOUNTER — Encounter: Payer: Self-pay | Admitting: *Deleted

## 2022-04-17 IMAGING — CT CT ABD-PELV W/O CM
2 of 4 series · 14 of 46 positions shown, 16 images · non-contrast
Comparison: None.

CLINICAL DATA: Hiatal hernia.

EXAM:
CT CHEST, ABDOMEN AND PELVIS WITHOUT CONTRAST
TECHNIQUE: Multidetector CT imaging of the chest, abdomen and pelvis was
performed following the standard protocol without IV contrast.

[Series 2: axials cap 5.00 · axial · 0.74mm/px · z∈[-1528,-968]mm · 11 of 134 slices shown, 13 images]
[im 11/134  soft-tissue]
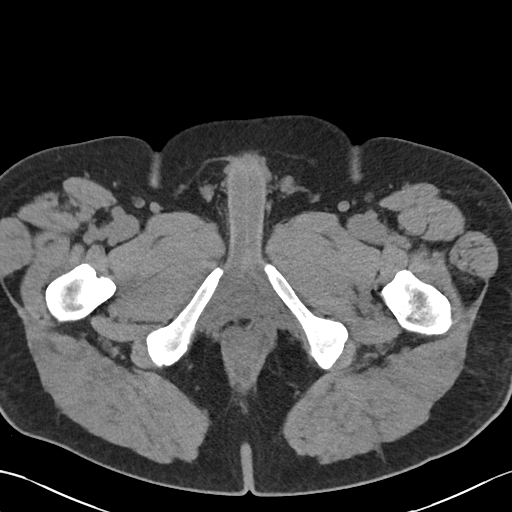
[im 11/134  bone]
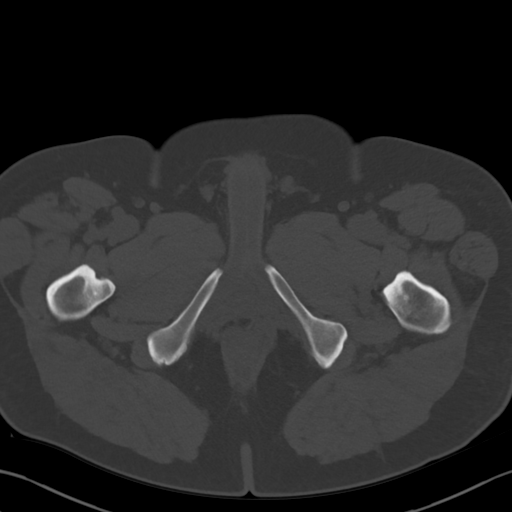
[im 21/134  soft-tissue]
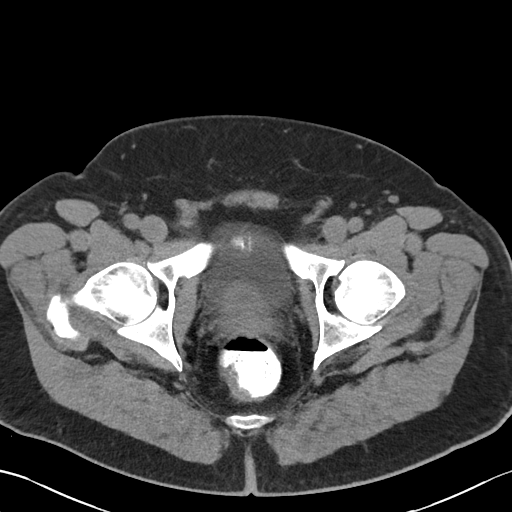
[im 31/134  soft-tissue]
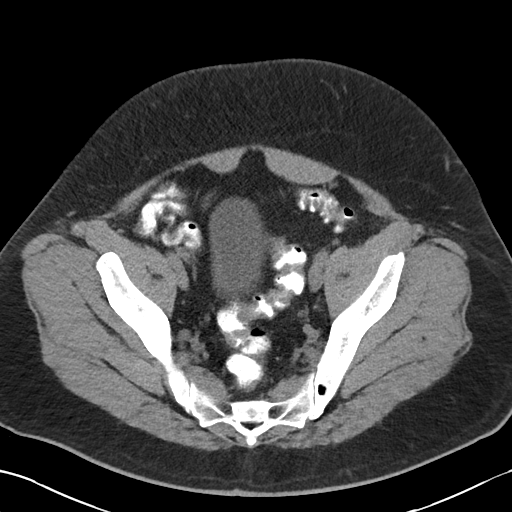
[im 41/134  soft-tissue]
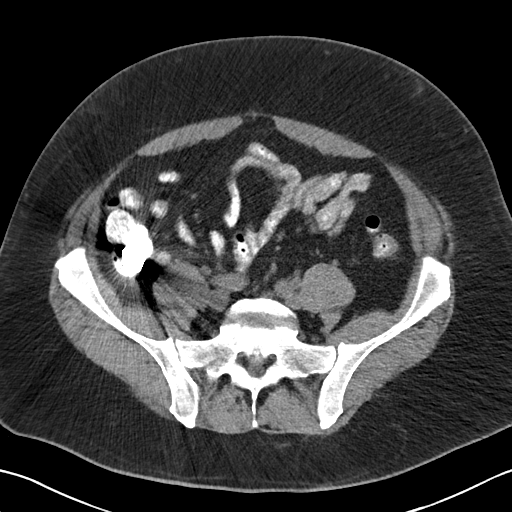
[im 52/134  soft-tissue]
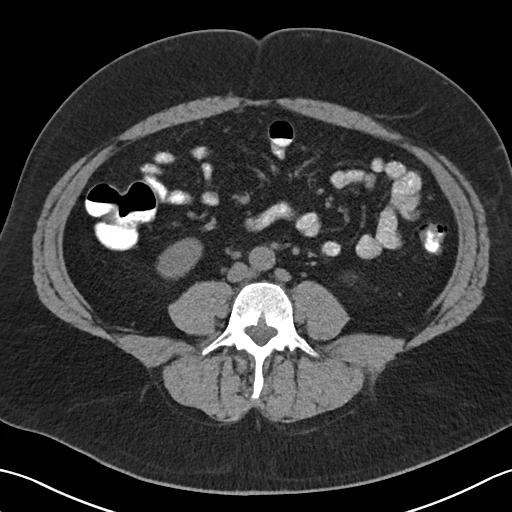
[im 72/134  soft-tissue]
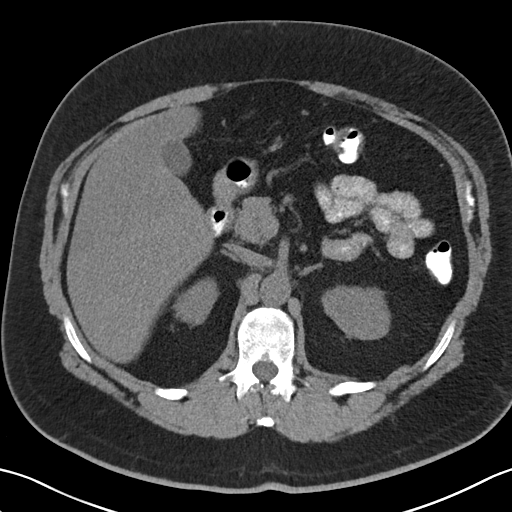
[im 82/134  soft-tissue]
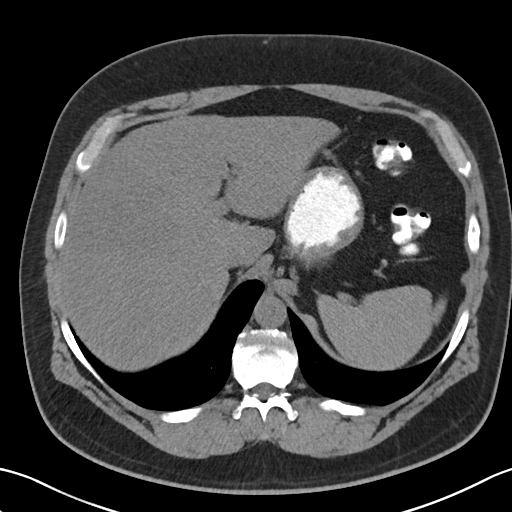
[im 93/134  soft-tissue]
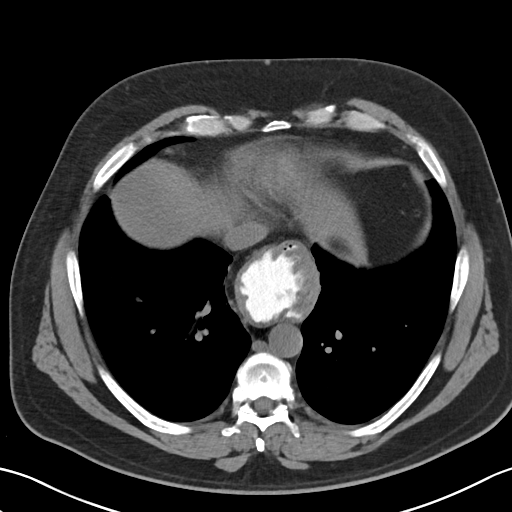
[im 103/134  soft-tissue]
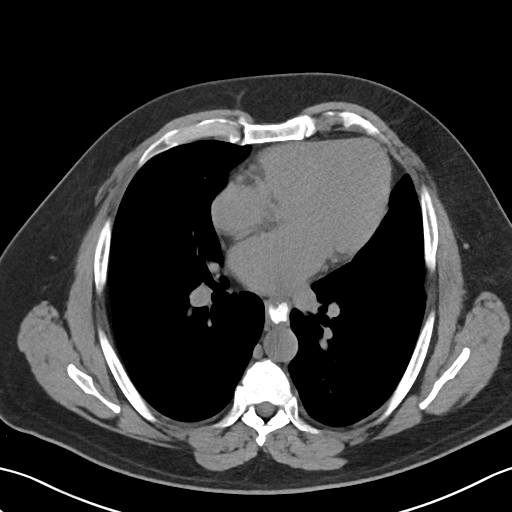
[im 103/134  bone]
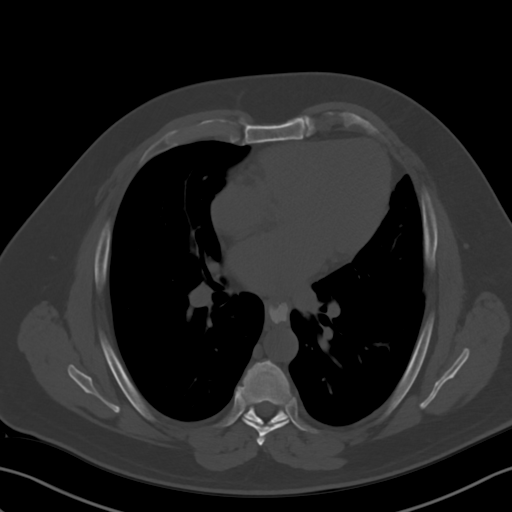
[im 113/134  soft-tissue]
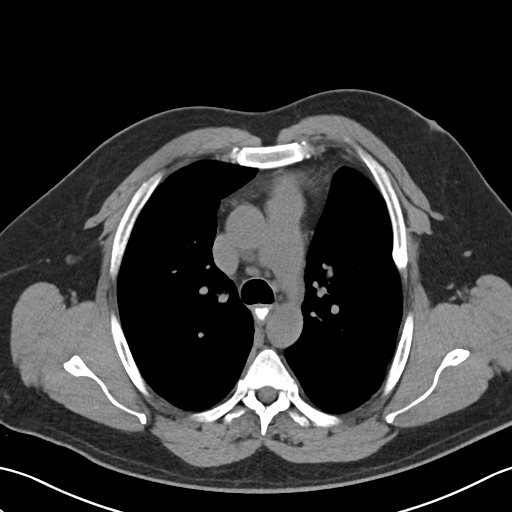
[im 123/134  soft-tissue]
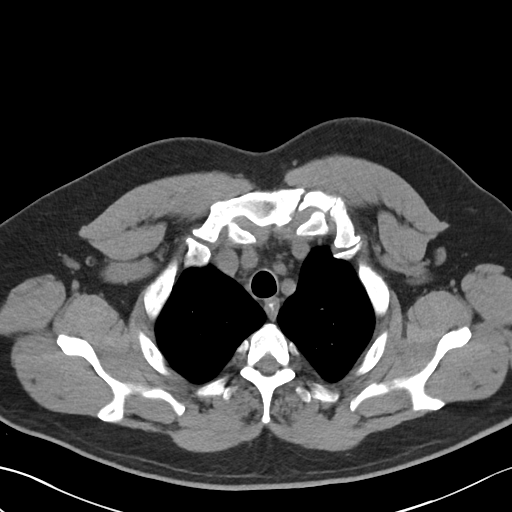

[Series 4: coronals cap 2.00 cor · coronal · 0.74mm/px · 3 of 175 slices shown]
[im 59/175  soft-tissue]
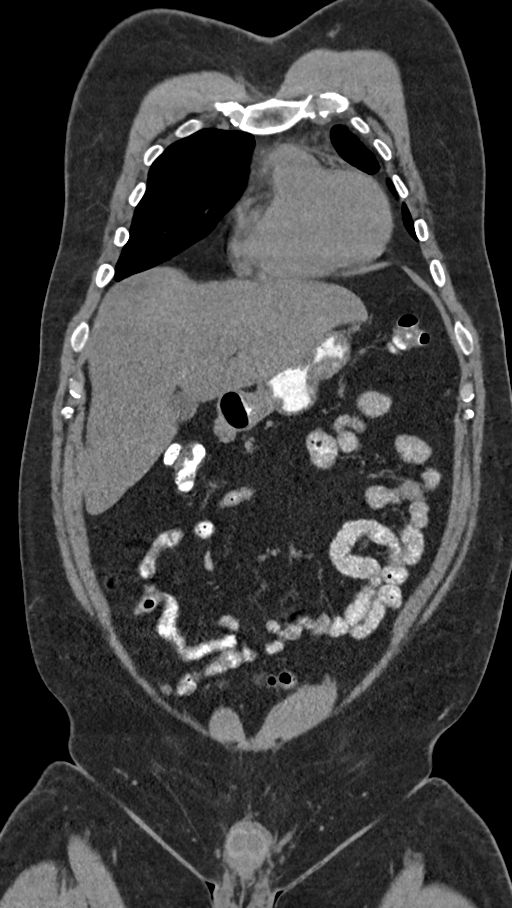
[im 78/175  soft-tissue]
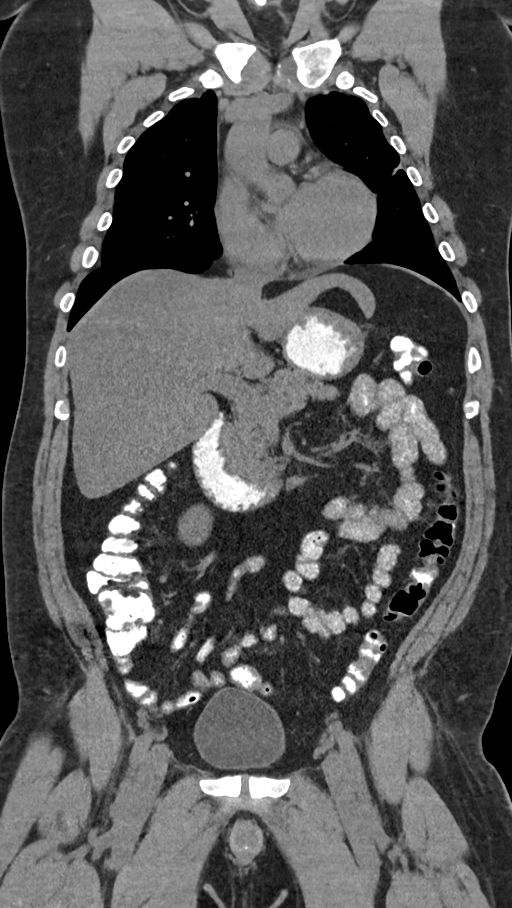
[im 97/175  soft-tissue]
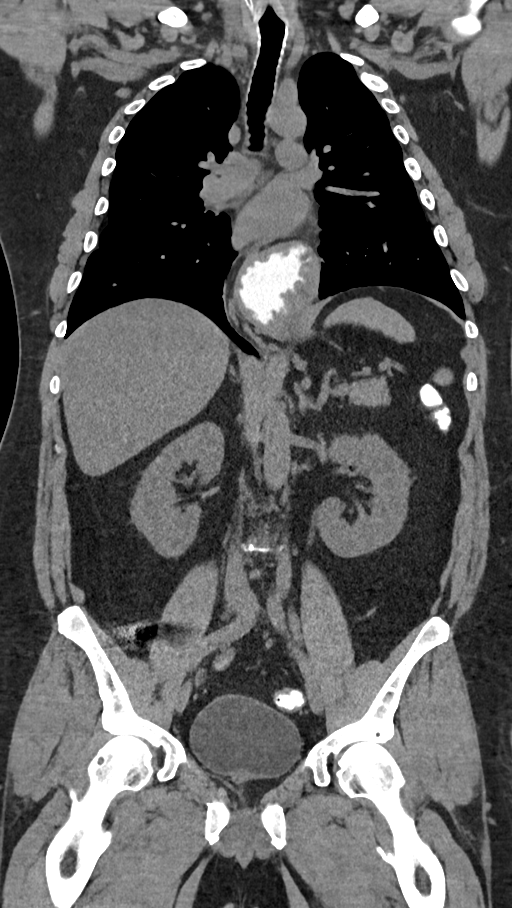

[14 of 46 positions shown; findings below may reference images not displayed]

FINDINGS: CT CHEST FINDINGS

Cardiovascular: No significant vascular findings. Normal heart size.
No pericardial effusion.

Mediastinum/Nodes: Thyroid gland is unremarkable. No adenopathy is
noted. Large sliding-type hiatal hernia is noted.

Lungs/Pleura: No pneumothorax or pleural effusion is noted. Probable
scarring or subsegmental atelectasis is noted in lingular segment of
left upper lobe as well as in the anterior portion of the right
upper lobe.

Musculoskeletal: No chest wall mass or suspicious bone lesions
identified.

CT ABDOMEN PELVIS FINDINGS

Hepatobiliary: No gallstones or biliary dilatation is noted. Hepatic
steatosis is noted.

Pancreas: Unremarkable. No pancreatic ductal dilatation or
surrounding inflammatory changes.

Spleen: Normal in size without focal abnormality.

Adrenals/Urinary Tract: Adrenal glands are unremarkable. Kidneys are
normal, without renal calculi, focal lesion, or hydronephrosis.
Bladder is unremarkable.

Stomach/Bowel: Stomach is within normal limits. Appendix appears
normal. No evidence of bowel wall thickening, distention, or
inflammatory changes.

Vascular/Lymphatic: No significant vascular findings are present. No
enlarged abdominal or pelvic lymph nodes.

Reproductive: Prostate is unremarkable.

Other: No abdominal wall hernia or abnormality. No abdominopelvic
ascites.

Musculoskeletal: Bilateral L5 spondylolysis is noted. No acute
osseous abnormality is noted
IMPRESSION: Large sliding-type hiatal hernia.

Probable scarring or subsegmental atelectasis is noted in lingular
segment of left upper lobe as well as anterior portion of right
upper lobe.

Hepatic steatosis.

Bilateral L5 spondylolysis.

## 2022-04-17 IMAGING — CT CT CHEST W/O CM
2 of 4 series · 13 of 36 positions shown, 16 images · non-contrast
Comparison: None.

CLINICAL DATA: Hiatal hernia.

EXAM:
CT CHEST, ABDOMEN AND PELVIS WITHOUT CONTRAST
TECHNIQUE: Multidetector CT imaging of the chest, abdomen and pelvis was
performed following the standard protocol without IV contrast.

[Series 2: axials cap 5.00 · axial · 0.74mm/px · z∈[-1523,-973]mm · 10 of 134 slices shown, 13 images]
[im 12/134  mediastinal]
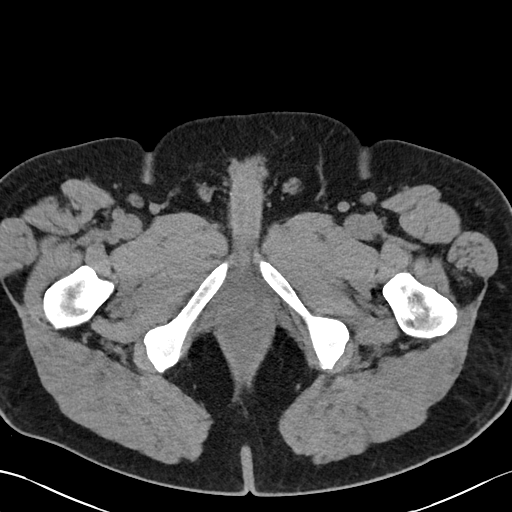
[im 12/134  lung]
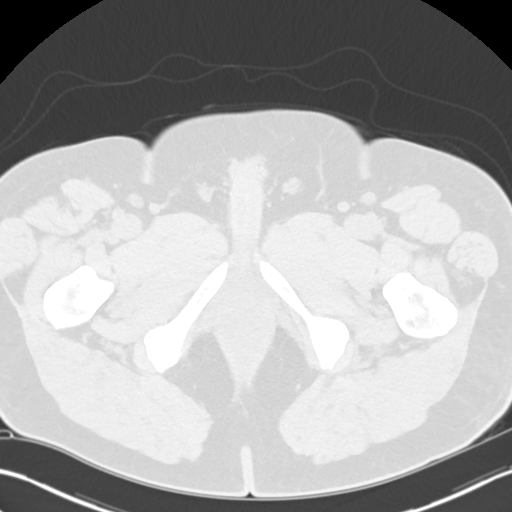
[im 23/134  lung]
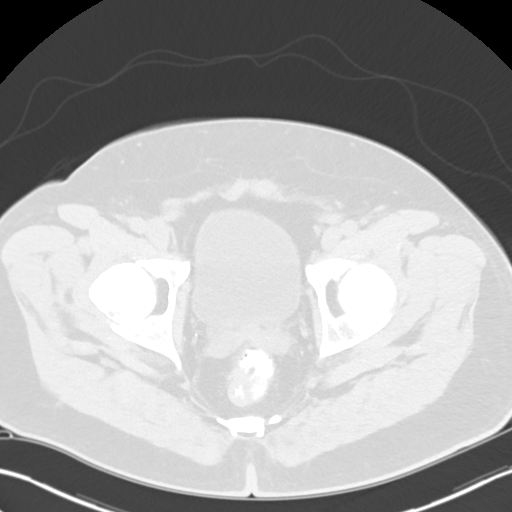
[im 34/134  lung]
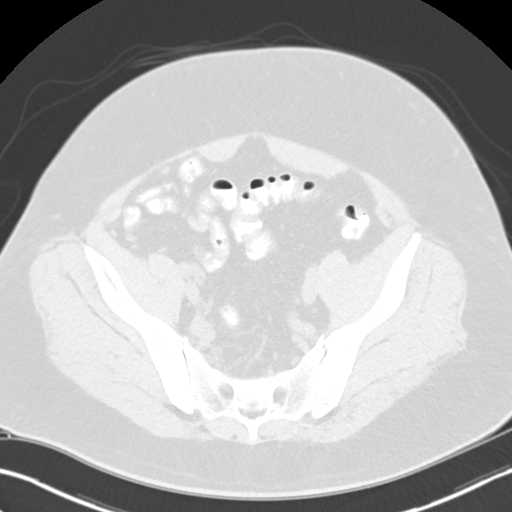
[im 45/134  lung]
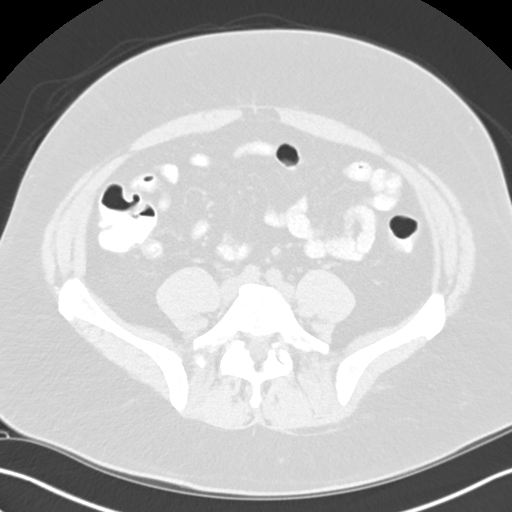
[im 56/134  mediastinal]
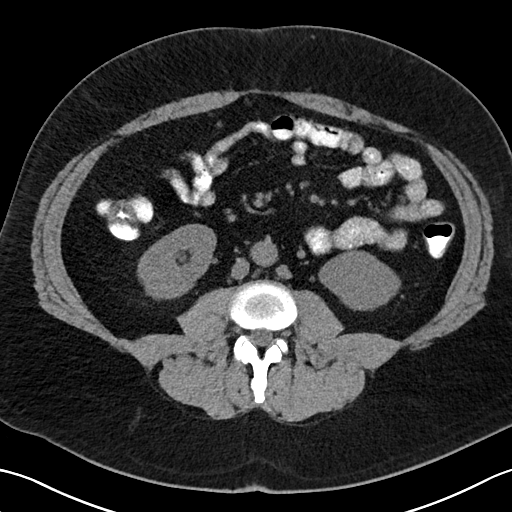
[im 56/134  lung]
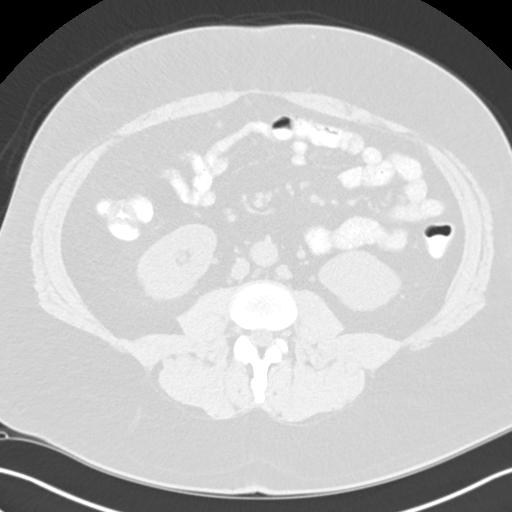
[im 78/134  lung]
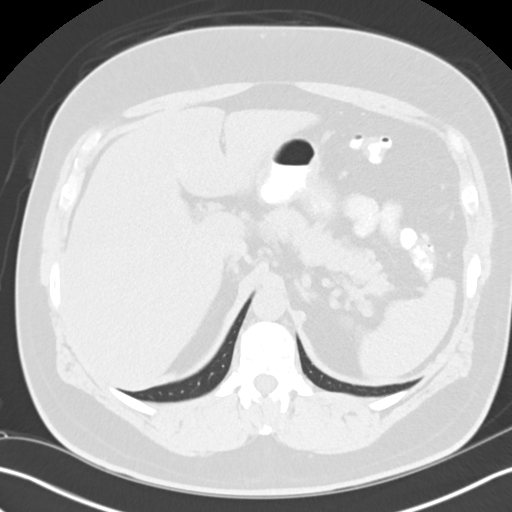
[im 89/134  lung]
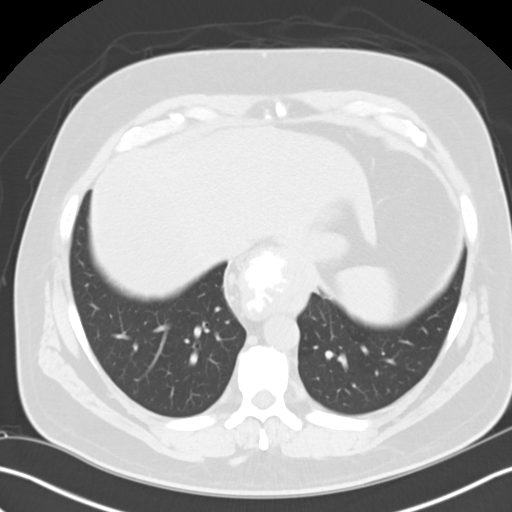
[im 100/134  lung]
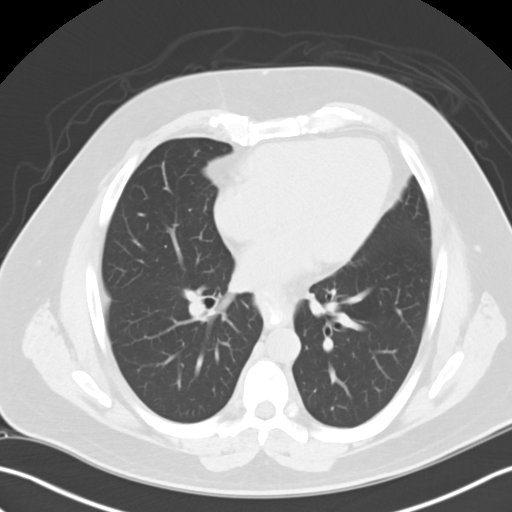
[im 111/134  mediastinal]
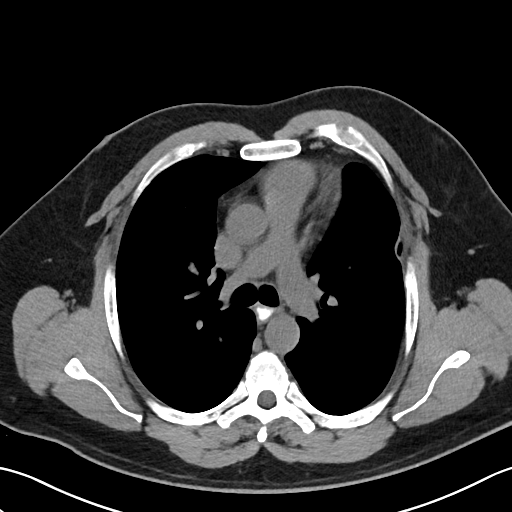
[im 111/134  lung]
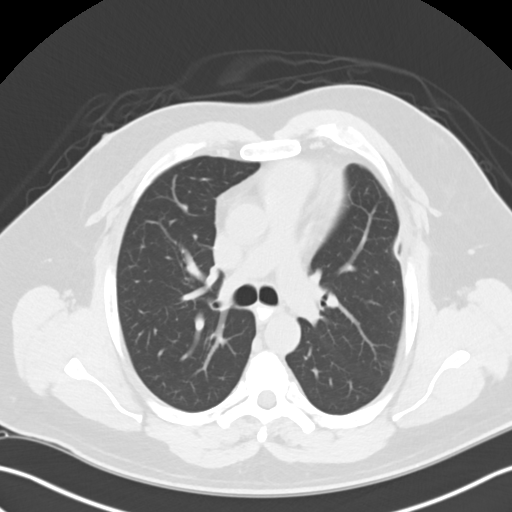
[im 122/134  lung]
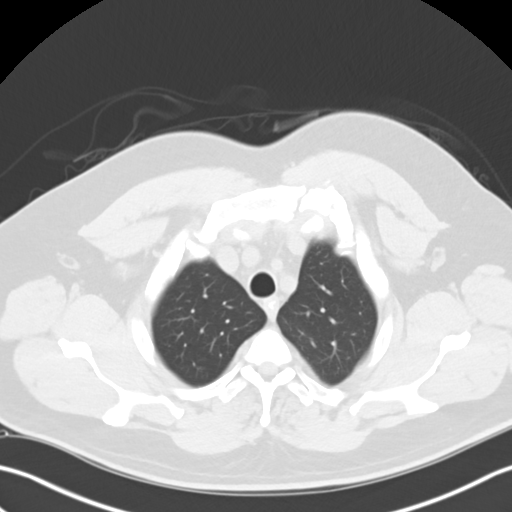

[Series 4: coronals cap 2.00 cor · coronal · 0.74mm/px · 3 of 175 slices shown]
[im 35/175  lung]
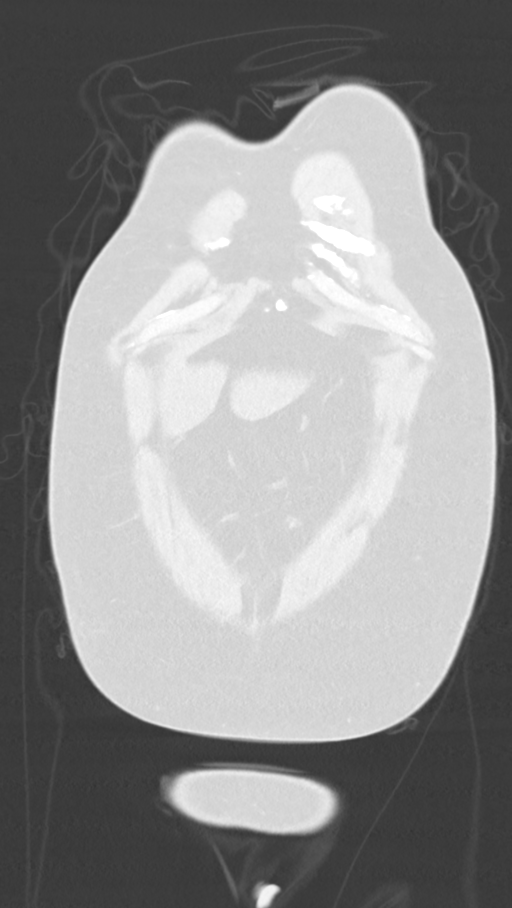
[im 70/175  lung]
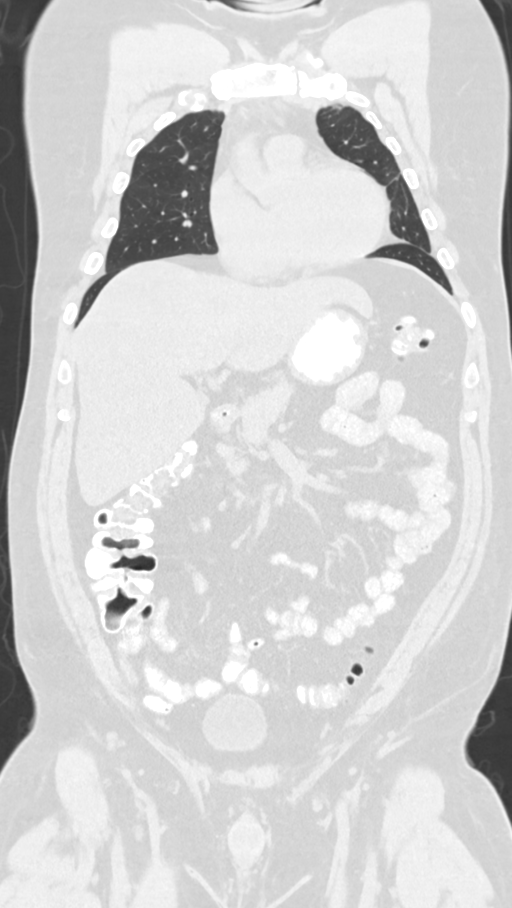
[im 105/175  lung]
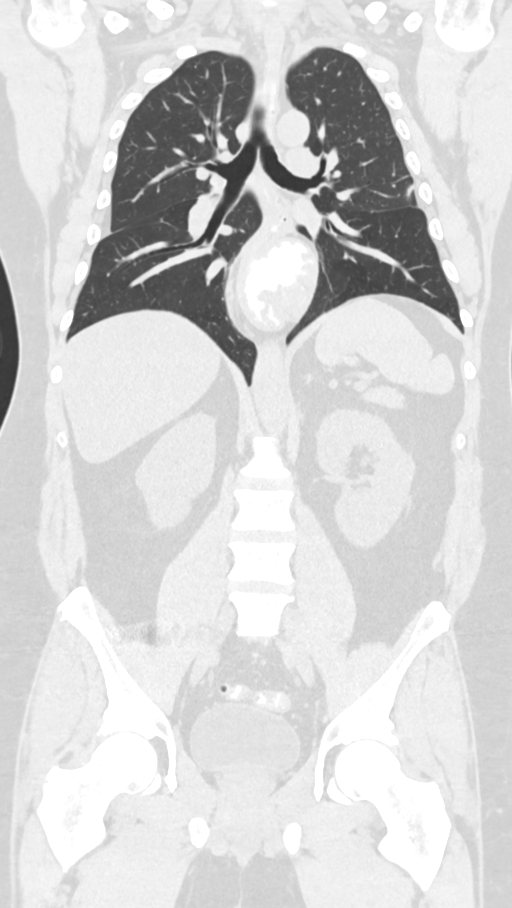

[13 of 36 positions shown; findings below may reference images not displayed]

FINDINGS: CT CHEST FINDINGS

Cardiovascular: No significant vascular findings. Normal heart size.
No pericardial effusion.

Mediastinum/Nodes: Thyroid gland is unremarkable. No adenopathy is
noted. Large sliding-type hiatal hernia is noted.

Lungs/Pleura: No pneumothorax or pleural effusion is noted. Probable
scarring or subsegmental atelectasis is noted in lingular segment of
left upper lobe as well as in the anterior portion of the right
upper lobe.

Musculoskeletal: No chest wall mass or suspicious bone lesions
identified.

CT ABDOMEN PELVIS FINDINGS

Hepatobiliary: No gallstones or biliary dilatation is noted. Hepatic
steatosis is noted.

Pancreas: Unremarkable. No pancreatic ductal dilatation or
surrounding inflammatory changes.

Spleen: Normal in size without focal abnormality.

Adrenals/Urinary Tract: Adrenal glands are unremarkable. Kidneys are
normal, without renal calculi, focal lesion, or hydronephrosis.
Bladder is unremarkable.

Stomach/Bowel: Stomach is within normal limits. Appendix appears
normal. No evidence of bowel wall thickening, distention, or
inflammatory changes.

Vascular/Lymphatic: No significant vascular findings are present. No
enlarged abdominal or pelvic lymph nodes.

Reproductive: Prostate is unremarkable.

Other: No abdominal wall hernia or abnormality. No abdominopelvic
ascites.

Musculoskeletal: Bilateral L5 spondylolysis is noted. No acute
osseous abnormality is noted
IMPRESSION: Large sliding-type hiatal hernia.

Probable scarring or subsegmental atelectasis is noted in lingular
segment of left upper lobe as well as anterior portion of right
upper lobe.

Hepatic steatosis.

Bilateral L5 spondylolysis.
